# Patient Record
Sex: Male | Born: 1970 | Race: White | Hispanic: No | Marital: Single | Smoking: Never smoker
Health system: Southern US, Community
[De-identification: ages and names within clinical notes are randomized; demographics above are authoritative.]

## PROBLEM LIST (undated history)

## (undated) DIAGNOSIS — E079 Disorder of thyroid, unspecified: Secondary | ICD-10-CM

## (undated) DIAGNOSIS — F329 Major depressive disorder, single episode, unspecified: Secondary | ICD-10-CM

## (undated) DIAGNOSIS — F32A Depression, unspecified: Secondary | ICD-10-CM

## (undated) DIAGNOSIS — T7840XA Allergy, unspecified, initial encounter: Secondary | ICD-10-CM

## (undated) HISTORY — PX: EYE SURGERY: SHX253

## (undated) HISTORY — DX: Depression, unspecified: F32.A

## (undated) HISTORY — DX: Major depressive disorder, single episode, unspecified: F32.9

## (undated) HISTORY — PX: FRACTURE SURGERY: SHX138

## (undated) HISTORY — DX: Disorder of thyroid, unspecified: E07.9

## (undated) HISTORY — DX: Allergy, unspecified, initial encounter: T78.40XA

## (undated) HISTORY — PX: SHOULDER SURGERY: SHX246

---

## 1997-06-04 ENCOUNTER — Emergency Department (HOSPITAL_COMMUNITY): Admission: EM | Admit: 1997-06-04 | Discharge: 1997-06-04 | Payer: Self-pay | Admitting: Emergency Medicine

## 1998-01-15 ENCOUNTER — Ambulatory Visit (HOSPITAL_BASED_OUTPATIENT_CLINIC_OR_DEPARTMENT_OTHER): Admission: RE | Admit: 1998-01-15 | Discharge: 1998-01-15 | Payer: Self-pay | Admitting: Orthopedic Surgery

## 1998-05-19 ENCOUNTER — Emergency Department (HOSPITAL_COMMUNITY): Admission: EM | Admit: 1998-05-19 | Discharge: 1998-05-19 | Payer: Self-pay | Admitting: Emergency Medicine

## 2011-01-27 ENCOUNTER — Ambulatory Visit (INDEPENDENT_AMBULATORY_CARE_PROVIDER_SITE_OTHER): Payer: 59

## 2011-01-27 DIAGNOSIS — J029 Acute pharyngitis, unspecified: Secondary | ICD-10-CM

## 2011-01-27 DIAGNOSIS — J020 Streptococcal pharyngitis: Secondary | ICD-10-CM

## 2011-11-03 ENCOUNTER — Ambulatory Visit (INDEPENDENT_AMBULATORY_CARE_PROVIDER_SITE_OTHER): Payer: 59 | Admitting: Physician Assistant

## 2011-11-03 VITALS — BP 126/74 | HR 78 | Temp 98.0°F | Resp 18 | Ht 70.5 in | Wt 203.0 lb

## 2011-11-03 DIAGNOSIS — Z23 Encounter for immunization: Secondary | ICD-10-CM

## 2011-11-03 DIAGNOSIS — Z202 Contact with and (suspected) exposure to infections with a predominantly sexual mode of transmission: Secondary | ICD-10-CM

## 2011-11-03 LAB — POCT URINALYSIS DIPSTICK
Bilirubin, UA: NEGATIVE
Glucose, UA: NEGATIVE
Nitrite, UA: NEGATIVE
Urobilinogen, UA: 0.2

## 2011-11-03 LAB — POCT CBC
Granulocyte percent: 71.3 %G (ref 37–80)
HCT, POC: 50.4 % (ref 43.5–53.7)
MCH, POC: 30.9 pg (ref 27–31.2)
MCV: 96.7 fL (ref 80–97)
POC LYMPH PERCENT: 24.4 %L (ref 10–50)
RBC: 5.21 M/uL (ref 4.69–6.13)
RDW, POC: 13.4 %
WBC: 8.4 10*3/uL (ref 4.6–10.2)

## 2011-11-03 LAB — POCT UA - MICROSCOPIC ONLY
RBC, urine, microscopic: NEGATIVE
WBC, Ur, HPF, POC: NEGATIVE

## 2011-11-03 NOTE — Progress Notes (Signed)
  Subjective:    Patient ID: Roger Oliver, male    DOB: [birth year], 41 y.o.   MRN: [id number]  HPI40 yr old CM here for STI screening.  He had a partner recently who told him she has HSV.  He has a history of cold sores but he hasnt had one in years.  He denies any lesions.  No dysuria, frequency, or penile discharge.   Review of Systems  All other systems reviewed and are negative.       Objective:   Physical Exam  Nursing note and vitals reviewed. Constitutional: He is oriented to person, place, and time. He appears well-developed and well-nourished.  HENT:  Head: Normocephalic and atraumatic.  Cardiovascular: Normal rate, regular rhythm and normal heart sounds.   Pulmonary/Chest: Effort normal and breath sounds normal.  Neurological: He is alert and oriented to person, place, and time.  Skin: Skin is warm and dry.      Results for orders placed in visit on 11/03/11  POCT CBC      Component Value Range   WBC 8.4  4.6 - 10.2 K/uL   Lymph, poc 2.0  0.6 - 3.4   POC LYMPH PERCENT 24.4  10 - 50 %L   MID (cbc) 0.4  0 - 0.9   POC MID % 4.3  0 - 12 %M   POC Granulocyte 6.0  2 - 6.9   Granulocyte percent 71.3  37 - 80 %G   RBC 5.21  4.69 - 6.13 M/uL   Hemoglobin 16.1  14.1 - 18.1 g/dL   HCT, POC 56.2  13.0 - 53.7 %   MCV 96.7  80 - 97 fL   MCH, POC 30.9  27 - 31.2 pg   MCHC 31.9  31.8 - 35.4 g/dL   RDW, POC 86.5     Platelet Count, POC 332  142 - 424 K/uL   MPV 7.8  0 - 99.8 fL  POCT URINALYSIS DIPSTICK      Component Value Range   Color, UA yellow     Clarity, UA clear     Glucose, UA neg     Bilirubin, UA neg     Ketones, UA neg     Spec Grav, UA <=1.005     Blood, UA neg     pH, UA 6.5     Protein, UA neg     Urobilinogen, UA 0.2     Nitrite, UA neg     Leukocytes, UA Negative         Assessment & Plan:  Possible exposure to herpes STI testing

## 2011-11-04 LAB — HIV ANTIBODY (ROUTINE TESTING W REFLEX): HIV: NONREACTIVE

## 2011-11-04 LAB — HSV(HERPES SIMPLEX VRS) I + II AB-IGG
HSV 1 Glycoprotein G Ab, IgG: 8.09 IV — ABNORMAL HIGH
HSV 2 Glycoprotein G Ab, IgG: 0.73 IV

## 2011-11-04 LAB — RPR

## 2011-11-04 LAB — GC/CHLAMYDIA PROBE AMP, URINE
Chlamydia, Swab/Urine, PCR: NEGATIVE
GC Probe Amp, Urine: NEGATIVE

## 2012-09-13 ENCOUNTER — Ambulatory Visit (INDEPENDENT_AMBULATORY_CARE_PROVIDER_SITE_OTHER): Payer: 59 | Admitting: Emergency Medicine

## 2012-09-13 ENCOUNTER — Ambulatory Visit: Payer: 59

## 2012-09-13 VITALS — BP 124/82 | HR 69 | Temp 97.7°F | Resp 18 | Ht 72.0 in | Wt 206.6 lb

## 2012-09-13 DIAGNOSIS — M25521 Pain in right elbow: Secondary | ICD-10-CM

## 2012-09-13 DIAGNOSIS — M25529 Pain in unspecified elbow: Secondary | ICD-10-CM

## 2012-09-13 MED ORDER — MELOXICAM 15 MG PO TABS: 15.0000 mg | ORAL_TABLET | Freq: Every day | ORAL | Status: DC

## 2012-09-13 NOTE — Progress Notes (Signed)
  Subjective:    Patient ID: Roger Oliver, male    DOB: 06/27/1970, 42 y.o.   MRN: [id number]  HPI  42 YO male patient here with right elbow pain. The pain is more in the antecubital area. No injury recalled. He does lift weights 2-3 times a week. It has been hurting for the last week. Movement makes the pain worse. Pain radiates into his shoulder. He has tried Aleve with no resolution.   Yesterday the pain was so severe that he did not use the arm all day long. Today it feels better than it has in the past week.  He still has pain.   Patient notes that when he is jogging he notices his fingers in the right hand become numb.    Review of Systems     Objective:   Physical Exam there is significant tenderness in the right antecubital fossa to there is no definite tendon rupture identified. He does have some weakness with grip strength  [facility] reading (PRIMARY) by  Dr.Devontae Casasola no fracture seen        Assessment & Plan:  I suspect patient has a partial tear of the right biceps tendon or other structures in the right antecubital fossa. We'll schedule an appointment to see the orthopedist placement of sling he is to be on the use of the right arm

## 2012-09-21 ENCOUNTER — Ambulatory Visit (INDEPENDENT_AMBULATORY_CARE_PROVIDER_SITE_OTHER): Payer: 59 | Admitting: Family Medicine

## 2012-09-21 VITALS — BP 123/74 | HR 77 | Temp 98.8°F | Resp 18 | Wt 204.0 lb

## 2012-09-21 DIAGNOSIS — Z202 Contact with and (suspected) exposure to infections with a predominantly sexual mode of transmission: Secondary | ICD-10-CM

## 2012-09-21 DIAGNOSIS — Z2089 Contact with and (suspected) exposure to other communicable diseases: Secondary | ICD-10-CM

## 2012-09-21 DIAGNOSIS — N5089 Other specified disorders of the male genital organs: Secondary | ICD-10-CM

## 2012-09-21 NOTE — Progress Notes (Signed)
  Subjective:    Patient ID: Roger Oliver, male    DOB: 1971-02-10, 42 y.o.   MRN: [id number]  HPI Patient presents with concern over lesions noted on his penis for last 2 weeks and irritation in his groin for last year when exercising.  He admits to sexual partner one year ago who told him that she had herpes afterwards, unsure if this is true.  He has had one negative test since then.  No new exposures.  No dysuria, urgency, frequency or discharge.  Past Medical History  Diagnosis Date  . Allergy    No past surgical history on file. No Known Allergies    Review of Systems  Constitutional: Negative for fever and chills.  Respiratory: Negative for cough.   Cardiovascular: Negative for chest pain.  Gastrointestinal: Negative for nausea and vomiting.  Genitourinary: Negative for dysuria, urgency, frequency, hematuria, flank pain, discharge, penile swelling, enuresis, penile pain and testicular pain.       Objective:   Physical Exam Blood pressure 123/74, pulse 77, temperature 98.8 F (37.1 C), temperature source Oral, resp. rate 18, weight 204 lb (92.534 kg), SpO2 100.00%. Body mass index is 27.66 kg/(m^2). Well-developed, well nourished male who is awake, alert and oriented, in NAD. HEENT: [place]/AT, EOMI.  Sclera and conjunctiva are clear.  Neck: supple, non-tender, no lymphadenopathy, thyromegaly. Heart: RRR, no murmur Lungs: normal effort, CTA Abdomen: normo-active bowel sounds, supple, non-tender, no mass or organomegaly. Extremities: no cyanosis, clubbing or edema. Skin: warm and dry without rash. Psychologic: good mood and appropriate affect, normal speech and behavior. GU:  #4 small lesions noted on glans, no vesicular component.  Small 1-43mm erythematous lesions.  No drainage.  Evidence of shaving.        Assessment & Plan:  A herpes culture was sent today of the lesions.  Advised patient to stop shaving region until irritation goes away.  Will follow culture  results.

## 2012-09-21 NOTE — Patient Instructions (Addendum)
A culture was sent today to evaluate for possible infection.  You should receive a call with the results in the next week.  If the test is positive we will arrange for treatment.  In the meantime, stop cleaning the area with alcohol and avoid shaving the area.

## 2012-09-28 NOTE — Progress Notes (Signed)
History and physical exam reviewed with Dr. McGrath. Agree with assessment and plan. 

## 2012-10-23 ENCOUNTER — Ambulatory Visit (INDEPENDENT_AMBULATORY_CARE_PROVIDER_SITE_OTHER): Payer: 59 | Admitting: Physician Assistant

## 2012-10-23 VITALS — BP 106/62 | HR 77 | Temp 98.7°F | Resp 18 | Wt 195.0 lb

## 2012-10-23 DIAGNOSIS — J069 Acute upper respiratory infection, unspecified: Secondary | ICD-10-CM

## 2012-10-23 MED ORDER — GUAIFENESIN ER 1200 MG PO TB12: 1.0000 | ORAL_TABLET | Freq: Two times a day (BID) | ORAL | Status: DC | PRN

## 2012-10-23 MED ORDER — IPRATROPIUM BROMIDE 0.03 % NA SOLN: 2.0000 | Freq: Two times a day (BID) | NASAL | Status: DC

## 2012-10-23 MED ORDER — HYDROCODONE-HOMATROPINE 5-1.5 MG/5ML PO SYRP: 5.0000 mL | ORAL_SOLUTION | Freq: Three times a day (TID) | ORAL | Status: DC | PRN

## 2012-10-23 MED ORDER — BENZONATATE 100 MG PO CAPS: 100.0000 mg | ORAL_CAPSULE | Freq: Three times a day (TID) | ORAL | Status: DC | PRN

## 2012-10-23 NOTE — Progress Notes (Signed)
  Subjective:    Patient ID: Roger Oliver, male    DOB: [birth year], 42 y.o.   MRN: [id number]  HPI   Roger Oliver is a 42 yr old male here with concern for illness.  States he has sore throat, nasal congestion, HA, ear pressure and is coughing constantly.  Symptoms have been present for 2 days.  Cough keeps him awake at night.  He uses CPAP nightly for sleep apnea and was unable to sleep due to cough.  Denies fever or chills.  Cough is occasionally productive of green mucus.  He has not used anything for his symptoms but has been increasing his fluid intake.     Review of Systems  Constitutional: Negative for fever and chills.  HENT: Positive for ear pain, congestion and sore throat.   Respiratory: Positive for cough. Negative for shortness of breath and wheezing.   Cardiovascular: Negative.   Gastrointestinal: Negative.   Musculoskeletal: Negative.   Skin: Negative.   Neurological: Positive for headaches.       Objective:   Physical Exam  Vitals reviewed. Constitutional: He is oriented to person, place, and time. He appears well-developed and well-nourished. No distress.  HENT:  Head: Normocephalic and atraumatic.  Right Ear: External ear and ear canal normal. Tympanic membrane is injected.  Left Ear: External ear and ear canal normal. Tympanic membrane is injected.  Nose: Nose normal. Right sinus exhibits no maxillary sinus tenderness and no frontal sinus tenderness. Left sinus exhibits no maxillary sinus tenderness and no frontal sinus tenderness.  Mouth/Throat: Uvula is midline, oropharynx is clear and moist and mucous membranes are normal.  Eyes: Conjunctivae are normal. No scleral icterus.  Neck: Neck supple.  Cardiovascular: Normal rate, regular rhythm and normal heart sounds.   Pulmonary/Chest: Effort normal and breath sounds normal. He has no wheezes. He has no rales.  Lymphadenopathy:    He has no cervical adenopathy.  Neurological: He is alert and oriented to person,  place, and time.  Skin: Skin is warm and dry.  Psychiatric: He has a normal mood and affect. His behavior is normal.        Assessment & Plan:  Viral URI with cough - Plan: HYDROcodone-homatropine (HYCODAN) 5-1.5 MG/5ML syrup, benzonatate (TESSALON) 100 MG capsule, ipratropium (ATROVENT) 0.03 % nasal spray, Guaifenesin (MUCINEX MAXIMUM STRENGTH) 1200 MG TB12   Roger Oliver is a 42 yr old male here with 2 days of URI symptoms.  Likely viral.  Will treat symptoms with Atrovent, Tessalon, Mucinex, and Hycodan.  Push fluids, rest.  RTC if worsening or not improving.

## 2012-10-23 NOTE — Patient Instructions (Addendum)
Atrovent nasal spray 2-3 times per day for relief of nasal congestion and post-nasal drainage.  Tessalon and Hycodan for cough (Hycodan will make you sleepy, so take at bedtime only)  Mucinex twice daily to help break up the congestion  Plenty of fluids (water is best!) and rest.  If worsening (fever >101.61F, worsening cough, shortness of breath, worsening pain) or not improving, please let us know.     Upper Respiratory Infection, Adult An upper respiratory infection (URI) is also sometimes known as the common cold. The upper respiratory tract includes the nose, sinuses, throat, trachea, and bronchi. Bronchi are the airways leading to the lungs. Most people improve within 1 week, but symptoms can last up to 2 weeks. A residual cough may last even longer.  CAUSES Many different viruses can infect the tissues lining the upper respiratory tract. The tissues become irritated and inflamed and often become very moist. Mucus production is also common. A cold is contagious. You can easily spread the virus to others by oral contact. This includes kissing, sharing a glass, coughing, or sneezing. Touching your mouth or nose and then touching a surface, which is then touched by another person, can also spread the virus. SYMPTOMS  Symptoms typically develop 1 to 3 days after you come in contact with a cold virus. Symptoms vary from person to person. They may include:  Runny nose.  Sneezing.  Nasal congestion.  Sinus irritation.  Sore throat.  Loss of voice (laryngitis).  Cough.  Fatigue.  Muscle aches.  Loss of appetite.  Headache.  Low-grade fever. DIAGNOSIS  You might diagnose your own cold based on familiar symptoms, since most people get a cold 2 to 3 times a year. Your caregiver can confirm this based on your exam. Most importantly, your caregiver can check that your symptoms are not due to another disease such as strep throat, sinusitis, pneumonia, asthma, or epiglottitis. Blood  tests, throat tests, and X-rays are not necessary to diagnose a common cold, but they may sometimes be helpful in excluding other more serious diseases. Your caregiver will decide if any further tests are required. RISKS AND COMPLICATIONS  You may be at risk for a more severe case of the common cold if you smoke cigarettes, have chronic heart disease (such as heart failure) or lung disease (such as asthma), or if you have a weakened immune system. The very young and very old are also at risk for more serious infections. Bacterial sinusitis, middle ear infections, and bacterial pneumonia can complicate the common cold. The common cold can worsen asthma and chronic obstructive pulmonary disease (COPD). Sometimes, these complications can require emergency medical care and may be life-threatening. PREVENTION  The best way to protect against getting a cold is to practice good hygiene. Avoid oral or hand contact with people with cold symptoms. Wash your hands often if contact occurs. There is no clear evidence that vitamin C, vitamin E, echinacea, or exercise reduces the chance of developing a cold. However, it is always recommended to get plenty of rest and practice good nutrition. TREATMENT  Treatment is directed at relieving symptoms. There is no cure. Antibiotics are not effective, because the infection is caused by a virus, not by bacteria. Treatment may include:  Increased fluid intake. Sports drinks offer valuable electrolytes, sugars, and fluids.  Breathing heated mist or steam (vaporizer or shower).  Eating chicken soup or other clear broths, and maintaining good nutrition.  Getting plenty of rest.  Using gargles or lozenges for comfort.  Controlling fevers with ibuprofen or acetaminophen as directed by your caregiver.  Increasing usage of your inhaler if you have asthma. Zinc gel and zinc lozenges, taken in the first 24 hours of the common cold, can shorten the duration and lessen the  severity of symptoms. Pain medicines may help with fever, muscle aches, and throat pain. A variety of non-prescription medicines are available to treat congestion and runny nose. Your caregiver can make recommendations and may suggest nasal or lung inhalers for other symptoms.  HOME CARE INSTRUCTIONS   Only take over-the-counter or prescription medicines for pain, discomfort, or fever as directed by your caregiver.  Use a warm mist humidifier or inhale steam from a shower to increase air moisture. This may keep secretions moist and make it easier to breathe.  Drink enough water and fluids to keep your urine clear or pale yellow.  Rest as needed.  Return to work when your temperature has returned to normal or as your caregiver advises. You may need to stay home longer to avoid infecting others. You can also use a face mask and careful hand washing to prevent spread of the virus. SEEK MEDICAL CARE IF:   After the first few days, you feel you are getting worse rather than better.  You need your caregiver's advice about medicines to control symptoms.  You develop chills, worsening shortness of breath, or brown or red sputum. These may be signs of pneumonia.  You develop yellow or brown nasal discharge or pain in the face, especially when you bend forward. These may be signs of sinusitis.  You develop a fever, swollen neck glands, pain with swallowing, or white areas in the back of your throat. These may be signs of strep throat. SEEK IMMEDIATE MEDICAL CARE IF:   You have a fever.  You develop severe or persistent headache, ear pain, sinus pain, or chest pain.  You develop wheezing, a prolonged cough, cough up blood, or have a change in your usual mucus (if you have chronic lung disease).  You develop sore muscles or a stiff neck. Document Released: 07/23/2000 Document Revised: 04/21/2011 Document Reviewed: 05/31/2010 [facility] Patient Information 2014 [place], [place].

## 2013-01-10 ENCOUNTER — Ambulatory Visit: Payer: 59

## 2013-01-10 ENCOUNTER — Ambulatory Visit (INDEPENDENT_AMBULATORY_CARE_PROVIDER_SITE_OTHER): Payer: 59 | Admitting: Family Medicine

## 2013-01-10 VITALS — BP 140/58 | HR 86 | Temp 98.0°F | Resp 18 | Ht 72.0 in | Wt 204.0 lb

## 2013-01-10 DIAGNOSIS — R059 Cough, unspecified: Secondary | ICD-10-CM

## 2013-01-10 DIAGNOSIS — R51 Headache: Secondary | ICD-10-CM

## 2013-01-10 DIAGNOSIS — R05 Cough: Secondary | ICD-10-CM

## 2013-01-10 DIAGNOSIS — J019 Acute sinusitis, unspecified: Secondary | ICD-10-CM

## 2013-01-10 DIAGNOSIS — J069 Acute upper respiratory infection, unspecified: Secondary | ICD-10-CM

## 2013-01-10 LAB — POCT CBC
Granulocyte percent: 85.9 %G — AB (ref 37–80)
MCV: 96.8 fL (ref 80–97)
MID (cbc): 0.9 (ref 0–0.9)
RBC: 4.9 M/uL (ref 4.69–6.13)

## 2013-01-10 LAB — POCT INFLUENZA A/B: Influenza B, POC: NEGATIVE

## 2013-01-10 MED ORDER — HYDROCODONE-HOMATROPINE 5-1.5 MG/5ML PO SYRP: 5.0000 mL | ORAL_SOLUTION | Freq: Three times a day (TID) | ORAL | Status: DC | PRN

## 2013-01-10 MED ORDER — PSEUDOEPHEDRINE HCL ER 120 MG PO TB12: 120.0000 mg | ORAL_TABLET | Freq: Two times a day (BID) | ORAL | Status: DC

## 2013-01-10 MED ORDER — AMOXICILLIN-POT CLAVULANATE 875-125 MG PO TABS: 1.0000 | ORAL_TABLET | Freq: Two times a day (BID) | ORAL | Status: DC

## 2013-01-10 MED ORDER — GUAIFENESIN ER 1200 MG PO TB12: 1.0000 | ORAL_TABLET | Freq: Two times a day (BID) | ORAL | Status: DC | PRN

## 2013-01-10 MED ORDER — IPRATROPIUM BROMIDE 0.03 % NA SOLN: 2.0000 | Freq: Two times a day (BID) | NASAL | Status: DC

## 2013-01-10 NOTE — Progress Notes (Addendum)
Subjective:    Patient ID: Roger Oliver, male    DOB: 04-Jul-1970, 42 y.o.   MRN: [id number] This chart was scribed for Roger Sorenson, MD by Valera Castle, ED Scribe. This patient was seen in room 11 and the patient's care was started at 8:38 AM.  Chief Complaint  Patient presents with  . Cough    X yesterday  . Headache    X yesterday  . Sore Throat    X yesterday   HPI [name] is a 42 y.o. male who presents to the [facility] complaining of sudden, moderate, waxing and waning, worsening, bilateral headache, onset yesterday morning. He reports diaphoresis last night, and associated sore throat after he woke up this morning. He states he had a hot drink that helped relieve the sore throat some. He also reports associated sinus pressure, postnasal drip, congestion, cough, productive of brown sputum, chills, and intermittent SOB due to the congestion.  He reports a h/o sleep apnea, stating he uses a sleep machine with a humidifier which seems to make his sxs much worse. He was unable to use his CPAP last night due to sxs so not sleeping as well.  He reports taking some Ibuprofen, 2 tablets, with little relief. He reports taking Zinc this morning. He denies being around anyone who has been sick. He denies chest pain, decreased appetite, bladder or bowel incontinence, fever, and any other associated symptoms.    He denies h/o smoking.  There are no active problems to display for this patient.  Past Medical History  Diagnosis Date  . Allergy   . Thyroid disease     Hypothyroidism   History reviewed. No pertinent past surgical history. Allergies  Allergen Reactions  . Shellfish Allergy    Prior to Admission medications   Medication Sig Start Date End Date Taking? Authorizing Provider  levothyroxine (SYNTHROID, LEVOTHROID) 50 MCG tablet Take 50 mcg by mouth daily.   Yes Historical Provider, MD  traZODone (DESYREL) 50 MG tablet Take 50 mg by mouth at bedtime.   Yes Historical Provider,  MD  benzonatate (TESSALON) 100 MG capsule Take 1-2 capsules (100-200 mg total) by mouth 3 (three) times daily as needed for cough. 10/23/12   Eleanore Delia Chimes, PA-C  Guaifenesin (MUCINEX MAXIMUM STRENGTH) 1200 MG TB12 Take 1 tablet (1,200 mg total) by mouth every 12 (twelve) hours as needed. 10/23/12   Eleanore Delia Chimes, PA-C  HYDROcodone-homatropine (HYCODAN) 5-1.5 MG/5ML syrup Take 5 mLs by mouth every 8 (eight) hours as needed for cough. 10/23/12   Eleanore Delia Chimes, PA-C  ipratropium (ATROVENT) 0.03 % nasal spray Place 2 sprays into the nose 2 (two) times daily. 10/23/12   Godfrey Pick, PA-C   Review of Systems  Constitutional: Positive for chills, diaphoresis, activity change and fatigue. Negative for fever, appetite change and unexpected weight change.  HENT: Positive for congestion, postnasal drip, sinus pressure and sore throat.   Respiratory: Positive for apnea, cough (productive of brown sputum) and shortness of breath. Negative for chest tightness and wheezing.   Cardiovascular: Negative for chest pain.  Genitourinary:       Negative for bladder and bowel incontinence.  Skin: Negative for rash.  Neurological: Positive for headaches (bilateral).  Hematological: Negative for adenopathy.  Psychiatric/Behavioral: Positive for sleep disturbance.    Triage Vitals: BP 140/58  Pulse 86  Temp(Src) 98 F (36.7 C) (Oral)  Resp 18  Ht 6' (1.829 m)  Wt 204 lb (92.534 kg)  BMI 27.66  kg/m2  SpO2 98%    Objective:   Physical Exam  Nursing note and vitals reviewed. Constitutional: He is oriented to person, place, and time. He appears well-developed and well-nourished. No distress.  HENT:  Head: Normocephalic and atraumatic.  Right Ear: External ear and ear canal normal. Tympanic membrane is injected and retracted.  Left Ear: External ear and ear canal normal. Tympanic membrane is injected. A middle ear effusion is present.  Nose: Mucosal edema (bilateral) present.  Mouth/Throat: Uvula is  midline and mucous membranes are normal. Posterior oropharyngeal edema (3 + over tonsils) and posterior oropharyngeal erythema present. No oropharyngeal exudate or tonsillar abscesses.  Eyes: Conjunctivae and EOM are normal.  Neck: Trachea normal. Neck supple. No JVD present. No tracheal deviation present. No mass and no thyromegaly present.  Cardiovascular: Normal rate, regular rhythm and normal heart sounds.  Exam reveals no gallop and no friction rub.   No murmur heard. Pulmonary/Chest: Effort normal. No stridor. No respiratory distress. He has decreased breath sounds. He has no wheezes. He has no rales.  Musculoskeletal: Normal range of motion.  Lymphadenopathy:    He has no cervical adenopathy.  Neurological: He is alert and oriented to person, place, and time.  Skin: Skin is warm and dry.  Diaphoretic skin noted on exam.  Psychiatric: He has a normal mood and affect. His behavior is normal.      Results for orders placed in visit on 01/10/13  POCT INFLUENZA A/B      Result Value Range   Influenza A, POC Negative     Influenza B, POC Negative    POCT CBC      Result Value Range   WBC 18.2 (*) 4.6 - 10.2 K/uL   Lymph, poc 1.7  0.6 - 3.4   POC LYMPH PERCENT 9.3 (*) 10 - 50 %L   MID (cbc) 0.9  0 - 0.9   POC MID % 4.8  0 - 12 %M   POC Granulocyte 15.6 (*) 2 - 6.9   Granulocyte percent 85.9 (*) 37 - 80 %G   RBC 4.90  4.69 - 6.13 M/uL   Hemoglobin 15.2  14.1 - 18.1 g/dL   HCT, POC 10.2  72.5 - 53.7 %   MCV 96.8  80 - 97 fL   MCH, POC 31.0  27 - 31.2 pg   MCHC 32.1  31.8 - 35.4 g/dL   RDW, POC 36.6     Platelet Count, POC 198  142 - 424 K/uL   MPV 7.3  0 - 99.8 fL   [facility] reading (PRIMARY) by  Dr. Clelia Croft. CXR: No acute abnormality. Assessment & Plan:  Headache(784.0)  Acute URI - Plan: POCT Influenza A/B, POCT CBC  Cough - Plan: DG Chest 2 View  Sinusitis, acute - Plan: Augmentin, sudafed, HYDROcodone-homatropine (HYCODAN) 5-1.5 MG/5ML syrup, Guaifenesin (MUCINEX MAXIMUM  STRENGTH) 1200 MG TB12, ipratropium (ATROVENT) 0.03 % nasal spray.  See pt instructions  Meds ordered this encounter  Medications  . HYDROcodone-homatropine (HYCODAN) 5-1.5 MG/5ML syrup    Sig: Take 5 mLs by mouth every 8 (eight) hours as needed for cough.    Dispense:  120 mL    Refill:  0    Order Specific Question:  Supervising Provider    Answer:  Ethelda Chick [[id number]]  . Guaifenesin (MUCINEX MAXIMUM STRENGTH) 1200 MG TB12    Sig: Take 1 tablet (1,200 mg total) by mouth every 12 (twelve) hours as needed.    Dispense:  14 tablet  Refill:  1    Order Specific Question:  Supervising Provider    Answer:  Ethelda Chick [[id number]]  . ipratropium (ATROVENT) 0.03 % nasal spray    Sig: Place 2 sprays into the nose 2 (two) times daily.    Dispense:  30 mL    Refill:  1    Order Specific Question:  Supervising Provider    Answer:  Ethelda Chick [[id number]]  . pseudoephedrine (SUDAFED 12 HOUR) 120 MG 12 hr tablet    Sig: Take 1 tablet (120 mg total) by mouth 2 (two) times daily.    Dispense:  30 tablet    Refill:  0  . amoxicillin-clavulanate (AUGMENTIN) 875-125 MG per tablet    Sig: Take 1 tablet by mouth 2 (two) times daily.    Dispense:  28 tablet    Refill:  0    I personally performed the services described in this documentation, which was scribed in my presence. The recorded information has been reviewed and considered, and addended by me as needed.  Roger Sorenson, MD MPH

## 2013-01-10 NOTE — Patient Instructions (Signed)
Hot showers or breathing in steam may help loosen the congestion.  Using a netti pot or sinus rinse is also likely to help you feel better and keep this from progressing.  Use the atrovent nasal spray as needed throughout the day.  I recommend augmenting with 12 hr sudafed (behind the counter) and generic mucinex to help you move out the congestion.  If no improvement or you are getting worse, come back as you might need a course of steroids but hopefully with all of the above, you can avoid it. If you are not feeling well enough to return to work on Thursday or you are feeling worse at all, please return to clinic.  Sinusitis Sinusitis is redness, soreness, and swelling (inflammation) of the paranasal sinuses. Paranasal sinuses are air pockets within the bones of your face (beneath the eyes, the middle of the forehead, or above the eyes). In healthy paranasal sinuses, mucus is able to drain out, and air is able to circulate through them by way of your nose. However, when your paranasal sinuses are inflamed, mucus and air can become trapped. This can allow bacteria and other germs to grow and cause infection. Sinusitis can develop quickly and last only a short time (acute) or continue over a long period (chronic). Sinusitis that lasts for more than 12 weeks is considered chronic.  CAUSES  Causes of sinusitis include:  Allergies.  Structural abnormalities, such as displacement of the cartilage that separates your nostrils (deviated septum), which can decrease the air flow through your nose and sinuses and affect sinus drainage.  Functional abnormalities, such as when the small hairs (cilia) that line your sinuses and help remove mucus do not work properly or are not present. SYMPTOMS  Symptoms of acute and chronic sinusitis are the same. The primary symptoms are pain and pressure around the affected sinuses. Other symptoms include:  Upper toothache.  Earache.  Headache.  Bad  breath.  Decreased sense of smell and taste.  A cough, which worsens when you are lying flat.  Fatigue.  Fever.  Thick drainage from your nose, which often is green and may contain pus (purulent).  Swelling and warmth over the affected sinuses. DIAGNOSIS  Your caregiver will perform a physical exam. During the exam, your caregiver may:  Look in your nose for signs of abnormal growths in your nostrils (nasal polyps).  Tap over the affected sinus to check for signs of infection.  View the inside of your sinuses (endoscopy) with a special imaging device with a light attached (endoscope), which is inserted into your sinuses. If your caregiver suspects that you have chronic sinusitis, one or more of the following tests may be recommended:  Allergy tests.  Nasal culture A sample of mucus is taken from your nose and sent to a lab and screened for bacteria.  Nasal cytology A sample of mucus is taken from your nose and examined by your caregiver to determine if your sinusitis is related to an allergy. TREATMENT  Most cases of acute sinusitis are related to a viral infection and will resolve on their own within 10 days. Sometimes medicines are prescribed to help relieve symptoms (pain medicine, decongestants, nasal steroid sprays, or saline sprays).  However, for sinusitis related to a bacterial infection, your caregiver will prescribe antibiotic medicines. These are medicines that will help kill the bacteria causing the infection.  Rarely, sinusitis is caused by a fungal infection. In theses cases, your caregiver will prescribe antifungal medicine. For some cases of  chronic sinusitis, surgery is needed. Generally, these are cases in which sinusitis recurs more than 3 times per year, despite other treatments. HOME CARE INSTRUCTIONS   Drink plenty of water. Water helps thin the mucus so your sinuses can drain more easily.  Use a humidifier.  Inhale steam 3 to 4 times a day (for example,  sit in the bathroom with the shower running).  Apply a warm, moist washcloth to your face 3 to 4 times a day, or as directed by your caregiver.  Use saline nasal sprays to help moisten and clean your sinuses.  Take over-the-counter or prescription medicines for pain, discomfort, or fever only as directed by your caregiver. SEEK IMMEDIATE MEDICAL CARE IF:  You have increasing pain or severe headaches.  You have nausea, vomiting, or drowsiness.  You have swelling around your face.  You have vision problems.  You have a stiff neck.  You have difficulty breathing. MAKE SURE YOU:   Understand these instructions.  Will watch your condition.  Will get help right away if you are not doing well or get worse. Document Released: 01/27/2005 Document Revised: 04/21/2011 Document Reviewed: 02/11/2011 [facility] Patient Information 2014 [place], [place].

## 2013-08-26 ENCOUNTER — Ambulatory Visit (INDEPENDENT_AMBULATORY_CARE_PROVIDER_SITE_OTHER): Payer: 59 | Admitting: Family Medicine

## 2013-08-26 ENCOUNTER — Ambulatory Visit (INDEPENDENT_AMBULATORY_CARE_PROVIDER_SITE_OTHER): Payer: 59

## 2013-08-26 VITALS — BP 120/52 | HR 55 | Temp 97.8°F | Resp 16 | Ht 71.75 in | Wt 192.4 lb

## 2013-08-26 DIAGNOSIS — IMO0002 Reserved for concepts with insufficient information to code with codable children: Secondary | ICD-10-CM

## 2013-08-26 DIAGNOSIS — R202 Paresthesia of skin: Secondary | ICD-10-CM

## 2013-08-26 DIAGNOSIS — S46911A Strain of unspecified muscle, fascia and tendon at shoulder and upper arm level, right arm, initial encounter: Secondary | ICD-10-CM

## 2013-08-26 DIAGNOSIS — M25519 Pain in unspecified shoulder: Secondary | ICD-10-CM

## 2013-08-26 DIAGNOSIS — M25511 Pain in right shoulder: Secondary | ICD-10-CM

## 2013-08-26 DIAGNOSIS — R209 Unspecified disturbances of skin sensation: Secondary | ICD-10-CM

## 2013-08-26 MED ORDER — PREDNISONE 20 MG PO TABS: ORAL_TABLET | ORAL | Status: DC

## 2013-08-26 MED ORDER — DICLOFENAC SODIUM 75 MG PO TBEC: 75.0000 mg | DELAYED_RELEASE_TABLET | Freq: Two times a day (BID) | ORAL | Status: DC

## 2013-08-26 MED ORDER — CYCLOBENZAPRINE HCL 5 MG PO TABS: ORAL_TABLET | ORAL | Status: DC

## 2013-08-26 NOTE — Progress Notes (Signed)
Subjective: 43 year old man who is here complaining of pain in his right shoulder. He had about 5 days when he was pulling on a bar to release a valve. There was tightening up in his right shoulder in area, an area that he had injured some time ago in the past. When he had problems in the past, he also had all kinds of problems with workers compensation. He is not at all interested in using workers comp. He prefers to use his own doctors. The hands a abnormal sensation in the right deltoid region. He moves certain ways and get a which shoots up toward his neck. He feels like he strained something in the shoulder itself. The intense burning pain woke him up this morning.  Objective: Neck has good range of motion and is nontender. Trapezius area is nontender. Shoulder has fairly good range of motion but gets occasional twinge of pain when he moves it around such as reaching across to his other shoulder. He is only minimally tender in the shoulder girdle and in the deltoid region. Biceps seems nontender. Grip is good. Pulses good. Sensory grossly normal in his hand but he feels like he has a abnormal sensation in the deltoid region.  Assessment: Right shoulder pain/strain Paresthesia  Plan: X-ray  [facility] reading (PRIMARY) by  Dr. Alwyn RenHopper Normal shoulder   Assessment: Shoulder pain and strain and paresthesia  Plan: With the burning pain shooting through there it sounds like a nerve may be irritated. I will go ahead treated with a round of prednisone, also in savings and when necessary pain medication and at bedtime muscle relaxant. If he does not rapidly improve we will refer him to PT and possibly to his shoulder orthopedist.

## 2013-08-26 NOTE — Patient Instructions (Signed)
Take prednisone 3 pills daily for 2 days, then daily for 2 days, then one daily for 2 days with breakfast  Take diclofenac one twice daily with breakfast and supper  Take muscle relaxants 2 at bedtime. On days that you are not working you can take one in the morning and one in the afternoon. May cause drowsiness.  If not doing better in 5 or 6 days we should recheck

## 2013-11-08 ENCOUNTER — Encounter: Payer: Self-pay | Admitting: Family Medicine

## 2013-11-08 ENCOUNTER — Ambulatory Visit (INDEPENDENT_AMBULATORY_CARE_PROVIDER_SITE_OTHER): Payer: 59 | Admitting: Family Medicine

## 2013-11-08 VITALS — BP 146/75 | HR 58 | Temp 97.3°F | Resp 18 | Ht 70.5 in | Wt 199.4 lb

## 2013-11-08 DIAGNOSIS — S139XXA Sprain of joints and ligaments of unspecified parts of neck, initial encounter: Secondary | ICD-10-CM

## 2013-11-08 DIAGNOSIS — M545 Low back pain, unspecified: Secondary | ICD-10-CM

## 2013-11-08 DIAGNOSIS — S161XXA Strain of muscle, fascia and tendon at neck level, initial encounter: Secondary | ICD-10-CM

## 2013-11-10 MED ORDER — CYCLOBENZAPRINE HCL 10 MG PO TABS: ORAL_TABLET | ORAL | Status: DC
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
## 2014-11-20 ENCOUNTER — Ambulatory Visit
Admission: RE | Admit: 2014-11-20 | Discharge: 2014-11-20 | Disposition: A | Discharge status: Home / Self Care | Payer: Worker's Compensation | Source: Ambulatory Visit | Attending: Nurse Practitioner | Admitting: Nurse Practitioner

## 2014-11-20 ENCOUNTER — Other Ambulatory Visit: Payer: Self-pay | Admitting: Nurse Practitioner

## 2014-11-20 DIAGNOSIS — M25511 Pain in right shoulder: Secondary | ICD-10-CM

## 2015-04-23 ENCOUNTER — Ambulatory Visit (INDEPENDENT_AMBULATORY_CARE_PROVIDER_SITE_OTHER): Payer: Commercial Managed Care - HMO | Admitting: Emergency Medicine

## 2015-04-23 ENCOUNTER — Ambulatory Visit (INDEPENDENT_AMBULATORY_CARE_PROVIDER_SITE_OTHER): Payer: Commercial Managed Care - HMO

## 2015-04-23 VITALS — BP 120/70 | HR 114 | Temp 98.8°F | Resp 20 | Ht 70.5 in | Wt 217.6 lb

## 2015-04-23 DIAGNOSIS — R509 Fever, unspecified: Secondary | ICD-10-CM

## 2015-04-23 DIAGNOSIS — R059 Cough, unspecified: Secondary | ICD-10-CM

## 2015-04-23 DIAGNOSIS — M791 Myalgia: Secondary | ICD-10-CM

## 2015-04-23 DIAGNOSIS — IMO0001 Reserved for inherently not codable concepts without codable children: Secondary | ICD-10-CM

## 2015-04-23 DIAGNOSIS — M609 Myositis, unspecified: Secondary | ICD-10-CM

## 2015-04-23 DIAGNOSIS — R05 Cough: Secondary | ICD-10-CM

## 2015-04-23 DIAGNOSIS — J029 Acute pharyngitis, unspecified: Secondary | ICD-10-CM | POA: Diagnosis not present

## 2015-04-23 LAB — POCT CBC
Granulocyte percent: 91.2 %G — AB (ref 37–80)
HCT, POC: 40 % — AB (ref 43.5–53.7)
Hemoglobin: 15.5 g/dL (ref 14.1–18.1)
LYMPH, POC: 0.4 — AB (ref 0.6–3.4)
MCH, POC: 34.2 pg — AB (ref 27–31.2)
MCHC: 38.7 g/dL — AB (ref 31.8–35.4)
MCV: 88.4 fL (ref 80–97)
MID (CBC): 0.2 (ref 0–0.9)
MPV: 6.3 fL (ref 0–99.8)
PLATELET COUNT, POC: 192 10*3/uL (ref 142–424)
POC Granulocyte: 6.1 (ref 2–6.9)
POC LYMPH %: 5.4 % — AB (ref 10–50)
POC MID %: 3.4 %M (ref 0–12)
RBC: 4.52 M/uL — AB (ref 4.69–6.13)
RDW, POC: 12.7 %
WBC: 6.7 10*3/uL (ref 4.6–10.2)

## 2015-04-23 LAB — POCT INFLUENZA A/B
INFLUENZA B, POC: NEGATIVE
Influenza A, POC: NEGATIVE

## 2015-04-23 LAB — POCT RAPID STREP A (OFFICE): RAPID STREP A SCREEN: NEGATIVE

## 2015-04-23 MED ORDER — OSELTAMIVIR PHOSPHATE 75 MG PO CAPS: 75.0000 mg | ORAL_CAPSULE | Freq: Two times a day (BID) | ORAL | Status: AC

## 2015-04-23 NOTE — Progress Notes (Addendum)
Subjective:  This chart was scribed for Roger ChrisSteven Lavell Supple MD, by [name],scribe, at [facility].  This patient was seen in room 13 and the patient's care was started at 1:29 PM.    Patient ID: Roger Oliver, male    DOB: 1971-01-23, 45 y.o.   MRN: [id number] Chief Complaint  Patient presents with  . Influenza    x 1 day    HPI HPI Comments: Roger Oliver is a 45 y.o. male who presents to the [facility] complaining of waking up this morning with the chills.  He went back to sleep and had a very intense headache when he woke up again.  He has associated symptoms of a sore throat,slight loss of appetite/nausea and body aches. Patient states that he had the heater on "full blast" while coming [facility] today and still felt cold. Patient had pneumonia 4-5 years ago.   Patient had shoulder surgery recently and states that he took pain medication this morning as it was also giving him pain.    There are no active problems to display for this patient.  Past Medical History  Diagnosis Date  . Allergy   . Thyroid disease     Hypothyroidism  . Depression    Past Surgical History  Procedure Laterality Date  . Eye surgery    . Fracture surgery    . Shoulder surgery     Allergies  Allergen Reactions  . Shellfish Allergy    Prior to Admission medications   Medication Sig Start Date End Date Taking? Authorizing Provider  levothyroxine (SYNTHROID, LEVOTHROID) 50 MCG tablet Take 50 mcg by mouth daily.   Yes Historical Provider, MD  traZODone (DESYREL) 50 MG tablet Take 50 mg by mouth at bedtime.   Yes Historical Provider, MD   Social History   Social History  . Marital Status: Single    Spouse Name: N/A  . Number of Children: N/A  . Years of Education: N/A   Occupational History  . Not on file.   Social History Main Topics  . Smoking status: Never Smoker   . Smokeless tobacco: Current User    Types: Snuff  . Alcohol Use: 3.0 oz/week     5 Cans of beer per week  . Drug Use: No  . Sexual Activity: Not on file   Other Topics Concern  . Not on file   Social History Narrative    Review of Systems  Constitutional: Positive for fever, chills and appetite change.  HENT: Positive for sore throat.   Eyes: Negative for pain, redness and itching.  Respiratory: Negative for choking and shortness of breath.   Cardiovascular: Negative for chest pain.  Gastrointestinal: Positive for nausea. Negative for vomiting.  Musculoskeletal: Positive for myalgias. Negative for neck pain and neck stiffness.  Neurological: Positive for headaches. Negative for syncope and speech difficulty.       Objective:   Physical Exam  Filed Vitals:   04/23/15 1324  BP: 120/70  Pulse: 114  Temp: 98.8 F (37.1 C)  TempSrc: Oral  Resp: 20  Height: 5' 10.5" (1.791 m)  Weight: 217 lb 9.6 oz (98.703 kg)  SpO2: 97%    CONSTITUTIONAL: Appears ill but not toxic HEAD: Normocephalic/atraumatic.  EYES: EOMI/PERRL ENMT: Mucous membranes moist NECK: Oliver no meningeal signs SPINE/BACK:entire spine nontender CV: S1/S2 noted, no murmurs/rubs/gallops noted LUNGS: Chest had a few rhonchi but no rales.  SKIN: warm, color normal PSYCH: no abnormalities  of mood noted, alert and oriented to situation Results for orders placed or performed in visit on 04/23/15  POCT Influenza A/B  Result Value Ref Range   Influenza A, POC Negative Negative   Influenza B, POC Negative Negative  POCT rapid strep A  Result Value Ref Range   Rapid Strep A Screen Negative Negative  Dg Chest 2 View  04/23/2015  CLINICAL DATA:  Fever and chills.  Body aches. EXAM: CHEST  2 VIEW COMPARISON:  01/10/2013 FINDINGS: Midline trachea.  Normal heart size and mediastinal contours. Sharp costophrenic angles.  No pneumothorax.  Clear lungs. IMPRESSION: No active cardiopulmonary disease. Electronically Signed   By: Jeronimo Greaves M.D.   On: 04/23/2015 14:23      Assessment & Plan:    Symptoms most consistent with influenza. Even though test is negative we'll treat with Tamiflu twice a day. He was given a note to be out of work until Thursday.I personally performed the services described in this documentation, which was scribed in my presence. The recorded information has been reviewed and is accurate.  Collene Gobble, MD

## 2015-04-23 NOTE — Patient Instructions (Addendum)
   IF you received an x-ray today, you will receive an invoice from [facility]. Please contact [facility] at [phone] with questions or concerns regarding your invoice.   IF you received labwork today, you will receive an invoice from [facility]. Please contact [facility] at [phone] with questions or concerns regarding your invoice.   Our billing staff will not be able to assist you with questions regarding bills from these companies.  You will be contacted with the lab results as soon as they are available. The fastest way to get your results is to activate your My Chart account. Instructions are located on the last page of this paperwork. If you have not heard from us regarding the results in 2 weeks, please contact this office.     Influenza, Adult Influenza ("the flu") is a viral infection of the respiratory tract. It occurs more often in winter months because people spend more time in close contact with one another. Influenza can make you feel very sick. Influenza easily spreads from person to person (contagious). CAUSES  Influenza is caused by a virus that infects the respiratory tract. You can catch the virus by breathing in droplets from an infected person's cough or sneeze. You can also catch the virus by touching something that was recently contaminated with the virus and then touching your mouth, nose, or eyes. RISKS AND COMPLICATIONS You may be at risk for a more severe case of influenza if you smoke cigarettes, have diabetes, have chronic heart disease (such as heart failure) or lung disease (such as asthma), or if you have a weakened immune system. Elderly people and pregnant women are also at risk for more serious infections. The most common problem of influenza is a lung infection (pneumonia). Sometimes, this problem can require emergency medical care and may be life threatening. SIGNS AND SYMPTOMS  Symptoms typically last 4  to 10 days and may include:  Fever.  Chills.  Headache, body aches, and muscle aches.  Sore throat.  Chest discomfort and cough.  Poor appetite.  Weakness or feeling tired.  Dizziness.  Nausea or vomiting. DIAGNOSIS  Diagnosis of influenza is often made based on your history and a physical exam. A nose or throat swab test can be done to confirm the diagnosis. TREATMENT  In mild cases, influenza goes away on its own. Treatment is directed at relieving symptoms. For more severe cases, your health care provider may prescribe antiviral medicines to shorten the sickness. Antibiotic medicines are not effective because the infection is caused by a virus, not by bacteria. HOME CARE INSTRUCTIONS  Take medicines only as directed by your health care provider.  Use a cool mist humidifier to make breathing easier.  Get plenty of rest until your temperature returns to normal. This usually takes 3 to 4 days.  Drink enough fluid to keep your urine clear or pale yellow.  Cover yourmouth and nosewhen coughing or sneezing,and wash your handswellto prevent thevirusfrom spreading.  Stay homefromwork orschool untilthe fever is gonefor at least 1full day. PREVENTION  An annual influenza vaccination (flu shot) is the best way to avoid getting influenza. An annual flu shot is now routinely recommended for all adults in the [place] SEEK MEDICAL CARE IF:  You experiencechest pain, yourcough worsens,or you producemore mucus.  Youhave nausea,vomiting, ordiarrhea.  Your fever returns or gets worse. SEEK IMMEDIATE MEDICAL CARE IF:  You havetrouble breathing, you become short of breath,or your skin ornails becomebluish.  You have severe painor   stiffnessin the neck.  You develop a sudden headache, or pain in the face or ear.  You have nausea or vomiting that you cannot control. MAKE SURE YOU:   Understand these instructions.  Will watch your condition.  Will get help  right away if you are not doing well or get worse.   This information is not intended to replace advice given to you by your health care provider. Make sure you discuss any questions you have with your health care provider.   Document Released: 01/25/2000 Document Revised: 02/17/2014 Document Reviewed: 04/28/2011 Elsevier Interactive Patient Education 2016 [employer]  

## 2016-02-03 DIAGNOSIS — Z79899 Other long term (current) drug therapy: Secondary | ICD-10-CM | POA: Insufficient documentation

## 2016-02-03 DIAGNOSIS — R040 Epistaxis: Secondary | ICD-10-CM | POA: Insufficient documentation

## 2016-02-03 DIAGNOSIS — E039 Hypothyroidism, unspecified: Secondary | ICD-10-CM | POA: Insufficient documentation

## 2016-02-03 DIAGNOSIS — F17290 Nicotine dependence, other tobacco product, uncomplicated: Secondary | ICD-10-CM | POA: Insufficient documentation

## 2016-02-03 NOTE — ED Triage Notes (Signed)
Patient reports nose bleed (right nare) for approximately 3 hours.  Patient denies history of same.

## 2016-02-04 ENCOUNTER — Emergency Department
Admission: EM | Admit: 2016-02-04 | Discharge: 2016-02-04 | Disposition: A | Discharge status: Home / Self Care | Payer: Self-pay | Attending: Emergency Medicine | Admitting: Emergency Medicine

## 2016-02-04 DIAGNOSIS — R040 Epistaxis: Secondary | ICD-10-CM

## 2016-02-04 LAB — COMPREHENSIVE METABOLIC PANEL
ALBUMIN: 4.6 g/dL (ref 3.5–5.0)
ALT: 44 U/L (ref 17–63)
ANION GAP: 7 (ref 5–15)
AST: 42 U/L — AB (ref 15–41)
Alkaline Phosphatase: 67 U/L (ref 38–126)
BILIRUBIN TOTAL: 0.7 mg/dL (ref 0.3–1.2)
BUN: 16 mg/dL (ref 6–20)
CHLORIDE: 104 mmol/L (ref 101–111)
CO2: 27 mmol/L (ref 22–32)
Calcium: 9.4 mg/dL (ref 8.9–10.3)
Creatinine, Ser: 0.93 mg/dL (ref 0.61–1.24)
GFR calc Af Amer: >60 mL/min (ref >60)
GFR calc non Af Amer: >60 mL/min (ref >60)
GLUCOSE: 100 mg/dL — AB (ref 65–99)
POTASSIUM: 3.7 mmol/L (ref 3.5–5.1)
Sodium: 138 mmol/L (ref 135–145)
TOTAL PROTEIN: 8 g/dL (ref 6.5–8.1)

## 2016-02-04 LAB — PROTIME-INR
INR: 0.97
PROTHROMBIN TIME: 12.9 s (ref 11.4–15.2)

## 2016-02-04 LAB — CBC
HEMATOCRIT: 42.5 % (ref 40.0–52.0)
Hemoglobin: 15.1 g/dL (ref 13.0–18.0)
MCH: 31.3 pg (ref 26.0–34.0)
MCHC: 35.6 g/dL (ref 32.0–36.0)
MCV: 87.8 fL (ref 80.0–100.0)
PLATELETS: 260 10*3/uL (ref 150–440)
RBC: 4.84 MIL/uL (ref 4.40–5.90)
RDW: 13 % (ref 11.5–14.5)
WBC: 9.8 10*3/uL (ref 3.8–10.6)

## 2016-02-04 LAB — APTT: APTT: 28 s (ref 24–36)

## 2016-02-04 MED ORDER — HYDROCOD POLST-CPM POLST ER 10-8 MG/5ML PO SUER
5.0000 mL | Freq: Once | ORAL | Status: AC
  Administered 2016-02-04: 5 mL via ORAL
  Filled 2016-02-04: qty 5

## 2016-02-04 MED ORDER — CEPHALEXIN 500 MG PO CAPS: 500.0000 mg | ORAL_CAPSULE | Freq: Four times a day (QID) | ORAL | 0 refills | Status: AC

## 2016-02-04 MED ORDER — CEPHALEXIN 500 MG PO CAPS
500.0000 mg | ORAL_CAPSULE | Freq: Once | ORAL | Status: AC
  Administered 2016-02-04: 500 mg via ORAL
  Filled 2016-02-04: qty 1

## 2016-02-04 MED ORDER — OXYMETAZOLINE HCL 0.05 % NA SOLN
NASAL | Status: AC
  Administered 2016-02-04
  Filled 2016-02-04: qty 15

## 2016-02-04 MED ORDER — LIDOCAINE HCL 4 % EX SOLN
CUTANEOUS | Status: AC
  Administered 2016-02-04
  Filled 2016-02-04: qty 50

## 2016-02-04 MED ORDER — HYDROCOD POLST-CPM POLST ER 10-8 MG/5ML PO SUER: 5.0000 mL | Freq: Two times a day (BID) | ORAL | 0 refills | Status: AC

## 2016-02-04 NOTE — Discharge Instructions (Addendum)
Please use Afrin to left near twice a day for the next 3 days. Please follow-up with ENT in the next 4-5 days. Should the bleeding return please return to the emergency department for further evaluation.

## 2016-02-04 NOTE — ED Notes (Signed)
Discharge instructions reviewed with patient. Questions fielded by this RN. Patient verbalizes understanding of instructions. Patient discharged home in stable condition per Webster MD . No acute distress noted at time of discharge.   

## 2016-02-04 NOTE — ED Notes (Signed)
ED Provider at bedside. 

## 2016-02-04 NOTE — ED Notes (Signed)
No bleeding noted except small once to left nare and stop quickly. Cough up some blood. Md Aware.

## 2016-02-04 NOTE — ED Provider Notes (Signed)
[facility] Emergency Department Provider Note   ____________________________________________   First MD Initiated Contact with Patient 02/04/16 0003     (approximate)  I have reviewed the triage vital signs and the nursing notes.   HISTORY  Chief Complaint Epistaxis    HPI Roger Oliver is a 45 y.o. male who comes into the hospital today with a nosebleed. The patient reports that he had one that started around 3:30. He reports though that it stopped on its own. The patient went to his girlfriend's family's house and he coughed really hard which then precipitated another nosebleed. He reports that that started around 7:30 and it has not stopped since then. The patient reports that he's bleeding on the right side. It is coming out of both nostrils at this time. The patient has never had anything like this happen in the past. The patient is not on any blood thinners. He denies drinking every day. He did have a sinus infection last weekend but he denies any pain. He is here because he is unable to stop bleeding from his nose.   Past Medical History:  Diagnosis Date  . Allergy   . Depression   . Thyroid disease    Hypothyroidism    There are no active problems to display for this patient.   Past Surgical History:  Procedure Laterality Date  . EYE SURGERY    . FRACTURE SURGERY    . SHOULDER SURGERY      Prior to Admission medications   Medication Sig Start Date End Date Taking? Authorizing Provider  cephALEXin (KEFLEX) 500 MG capsule Take 1 capsule (500 mg total) by mouth 4 (four) times daily. 02/04/16 02/08/16  Rebecka ApleyAllison P Bailynn Dyk, MD  chlorpheniramine-HYDROcodone [facility](TUSSIONEX PENNKINETIC ER) 10-8 MG/5ML SUER Take 5 mLs by mouth 2 (two) times daily. 02/04/16   Rebecka ApleyAllison P Ajiah Mcglinn, MD  levothyroxine (SYNTHROID, LEVOTHROID) 50 MCG tablet Take 50 mcg by mouth daily.    Historical Provider, MD  oseltamivir (TAMIFLU) 75 MG capsule Take 1 capsule (75 mg  total) by mouth 2 (two) times daily. 04/23/15   Collene GobbleSteven A Daub, MD  traZODone (DESYREL) 50 MG tablet Take 50 mg by mouth at bedtime.    Historical Provider, MD    Allergies Shellfish allergy  Family History  Problem Relation Age of Onset  . Diabetes Father   . Heart disease Father   . Hyperlipidemia Father   . Hyperlipidemia Mother   . Cancer Sister   . Hyperlipidemia Brother   . Diabetes Brother   . Diabetes Sister     Social History Social History  Substance Use Topics  . Smoking status: Never Smoker  . Smokeless tobacco: Current User    Types: Snuff  . Alcohol use 3.0 oz/week    5 Cans of beer per week    Review of Systems Constitutional: No fever/chills Eyes: No visual changes. ENT: Nosebleed Cardiovascular: Denies chest pain. Respiratory: Denies shortness of breath. Gastrointestinal: No abdominal pain.  No nausea, no vomiting.  No diarrhea.  No constipation. Genitourinary: Negative for dysuria. Musculoskeletal: Negative for back pain. Skin: Negative for rash. Neurological: Negative for headaches, focal weakness or numbness.  10-point ROS otherwise negative.  ____________________________________________   PHYSICAL EXAM:  VITAL SIGNS: ED Triage Vitals [02/03/16 2216]  Enc Vitals Group     BP (!) 143/86     Pulse Rate (!) 58     Resp 17     Temp      Temp Source  Oral     SpO2 98 %     Weight 212 lb (96.2 kg)     Height 6' (1.829 m)     Head Circumference      Peak Flow      Pain Score      Pain Loc      Pain Edu?      Excl. in GC?     Constitutional: Alert and oriented. Well appearing and in Moderate distress. Eyes: Conjunctivae are normal. PERRL. EOMI. Head: Atraumatic. Nose: Epistaxis. Active brisk bleeding coming from the right nostril with clots and more bleeding coming from the left nostril. Mouth/Throat: Mucous membranes are moist.  Oropharynx non-erythematous. Cardiovascular: Normal rate, regular rhythm. Grossly normal heart sounds.  Good  peripheral circulation. Respiratory: Normal respiratory effort.  No retractions. Lungs CTAB. Gastrointestinal: Soft and nontender. No distention.  Musculoskeletal: No lower extremity tenderness nor edema.   Neurologic:  Normal speech and language.  Skin:  Skin is warm, dry and intact.  Psychiatric: Mood and affect are normal.   ____________________________________________   LABS (all labs ordered are listed, but only abnormal results are displayed)  Labs Reviewed  COMPREHENSIVE METABOLIC PANEL - Abnormal; Notable for the following:       Result Value   Glucose, Bld 100 (*)    AST 42 (*)    All other components within normal limits  CBC  PROTIME-INR  APTT   ____________________________________________  EKG  none ____________________________________________  RADIOLOGY  none ____________________________________________   PROCEDURES  Procedure(s) performed: please, see procedure note(s).  .Epistaxis Management Date/Time: 02/04/2016 12:15 AM Performed by: Rebecka ApleyWEBSTER, Denean Pavon P Authorized by: Rebecka ApleyWEBSTER, Ege Muckey P   Consent:    Consent obtained:  Verbal   Consent given by:  Patient Anesthesia (see MAR for exact dosages):    Anesthesia method:  Topical application   Topical anesthesia: lidocaine. Procedure details:    Treatment site:  Unable to specify   Treatment method:  Merocel sponge   Treatment complexity:  Limited   Treatment episode: initial   Post-procedure details:    Assessment:  Bleeding stopped   Patient tolerance of procedure:  Tolerated well, no immediate complications    Critical Care performed: No  ____________________________________________   INITIAL IMPRESSION / ASSESSMENT AND PLAN / ED COURSE  Pertinent labs & imaging results that were available during my care of the patient were reviewed by me and considered in my medical decision making (see chart for details).  This is a 45 year old male who comes into the hospital today with  epistaxis. I did go into the room immediately to help alleviate the bleeding. I instilled Afrin to the patient's nares bilaterally but he did not have any improvement in the bleeding. I placed a Merocel sponge into the patient's right nostril and then I did pinch the patient's nostrils. After approximately 15 minutes I did go back to recheck on the patient. I removed the pincers and although there was a slight amount of bleeding from the left nostril it cleared up and the patient stopped having bleeding. I will check some blood work and I will then reassess the patient's bleeding.  Clinical Course    The patient's blood work is unremarkable. After some time in the emergency department the patient states that he still just has some slight bleeding when he dabs his nose but no active bleeding. The patient does not want a Murocel packing to the left nose. I will discharge the patient home to have him follow back up  with ENT. He should return with any worsening symptoms.  ____________________________________________   FINAL CLINICAL IMPRESSION(S) / ED DIAGNOSES  Final diagnoses:  Epistaxis      NEW MEDICATIONS STARTED DURING THIS VISIT:  New Prescriptions   CEPHALEXIN (KEFLEX) 500 MG CAPSULE    Take 1 capsule (500 mg total) by mouth 4 (four) times daily.   CHLORPHENIRAMINE-HYDROCODONE (TUSSIONEX PENNKINETIC ER) 10-8 MG/5ML SUER    Take 5 mLs by mouth 2 (two) times daily.     Note:  This document was prepared using Dragon voice recognition software and may include unintentional dictation errors.    Rebecka Apley, MD 02/04/16 [id number]

## 2016-02-04 NOTE — ED Notes (Signed)
Merocel applied into right nare. Large clots of left nare at this time. Nose clip applied. Will reassess.

## 2016-04-25 DIAGNOSIS — R946 Abnormal results of thyroid function studies: Secondary | ICD-10-CM | POA: Diagnosis not present

## 2017-01-28 DIAGNOSIS — Z136 Encounter for screening for cardiovascular disorders: Secondary | ICD-10-CM | POA: Diagnosis not present

## 2017-01-28 DIAGNOSIS — Z0000 Encounter for general adult medical examination without abnormal findings: Secondary | ICD-10-CM | POA: Diagnosis not present

## 2017-01-28 DIAGNOSIS — Z23 Encounter for immunization: Secondary | ICD-10-CM | POA: Diagnosis not present

## 2017-10-17 IMAGING — CR DG CHEST 2V
2 series · 2 of 2 positions shown · non-contrast
Comparison: 01/10/2013

CLINICAL DATA: Fever and chills.  Body aches.

EXAM:
CHEST  2 VIEW

[PA]
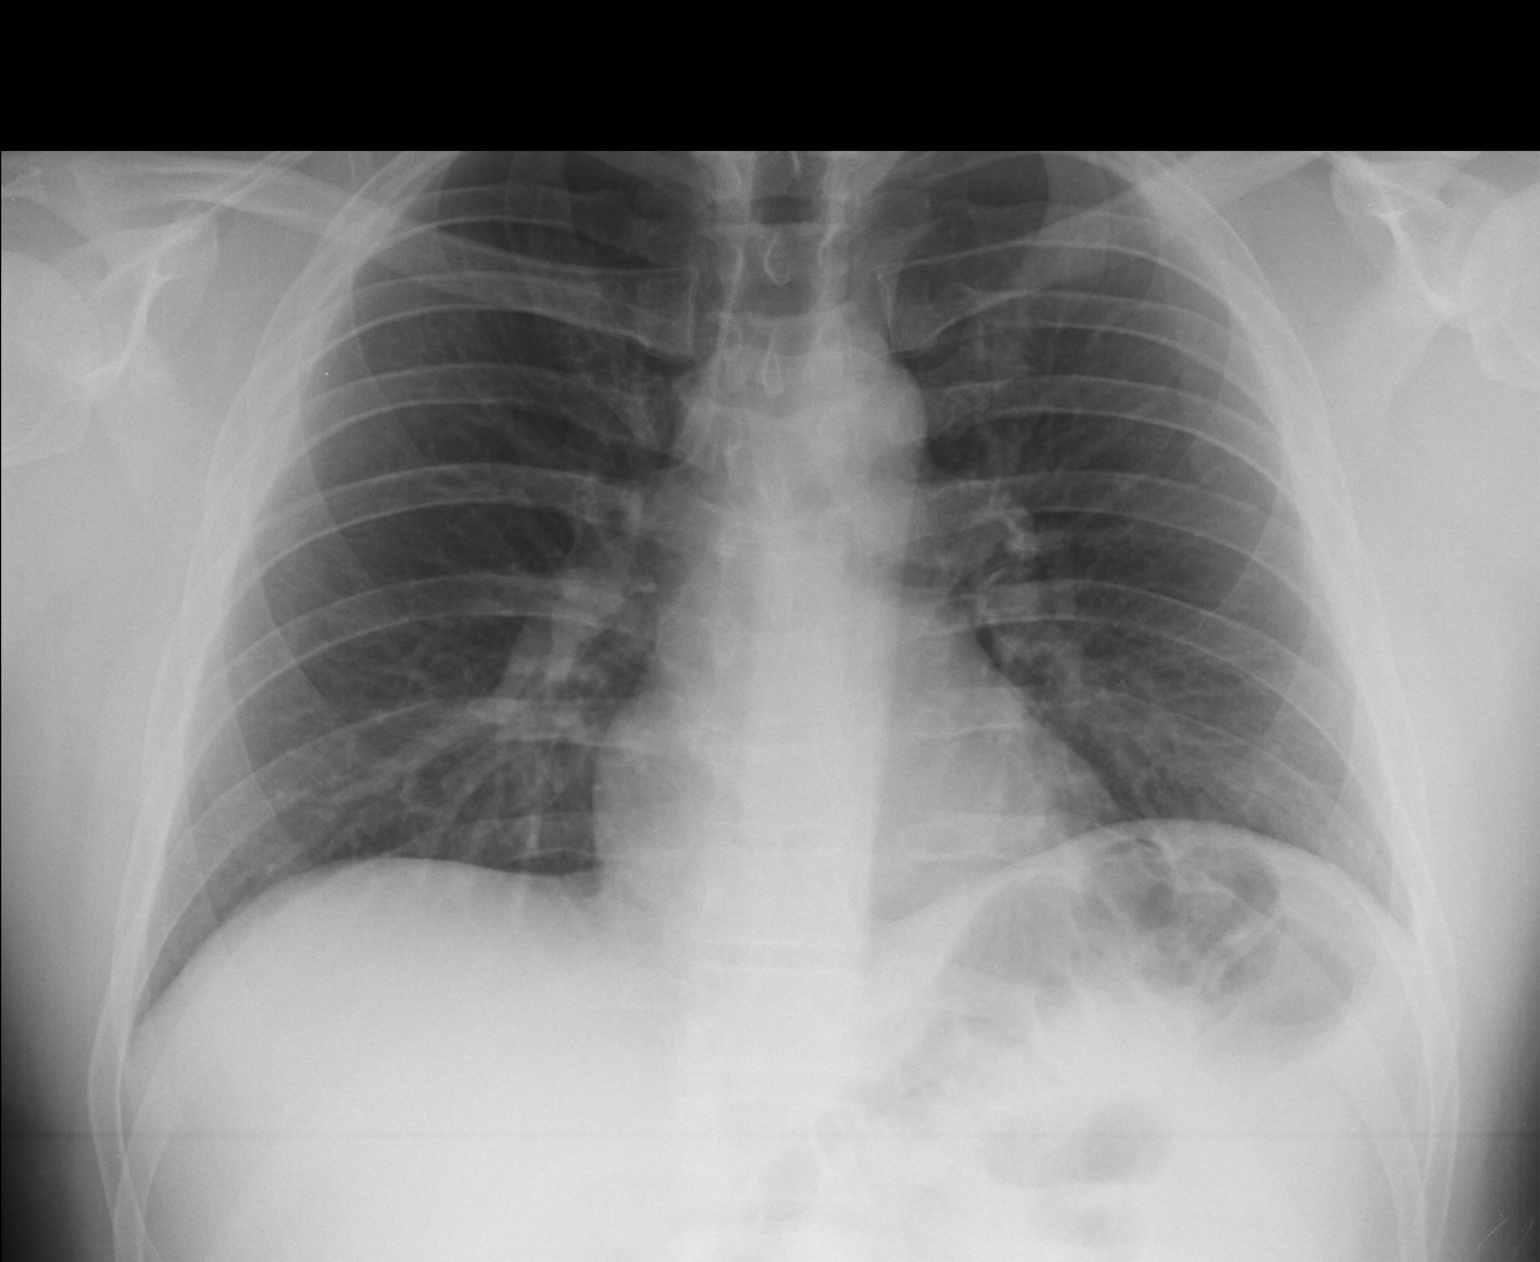

[lateral]
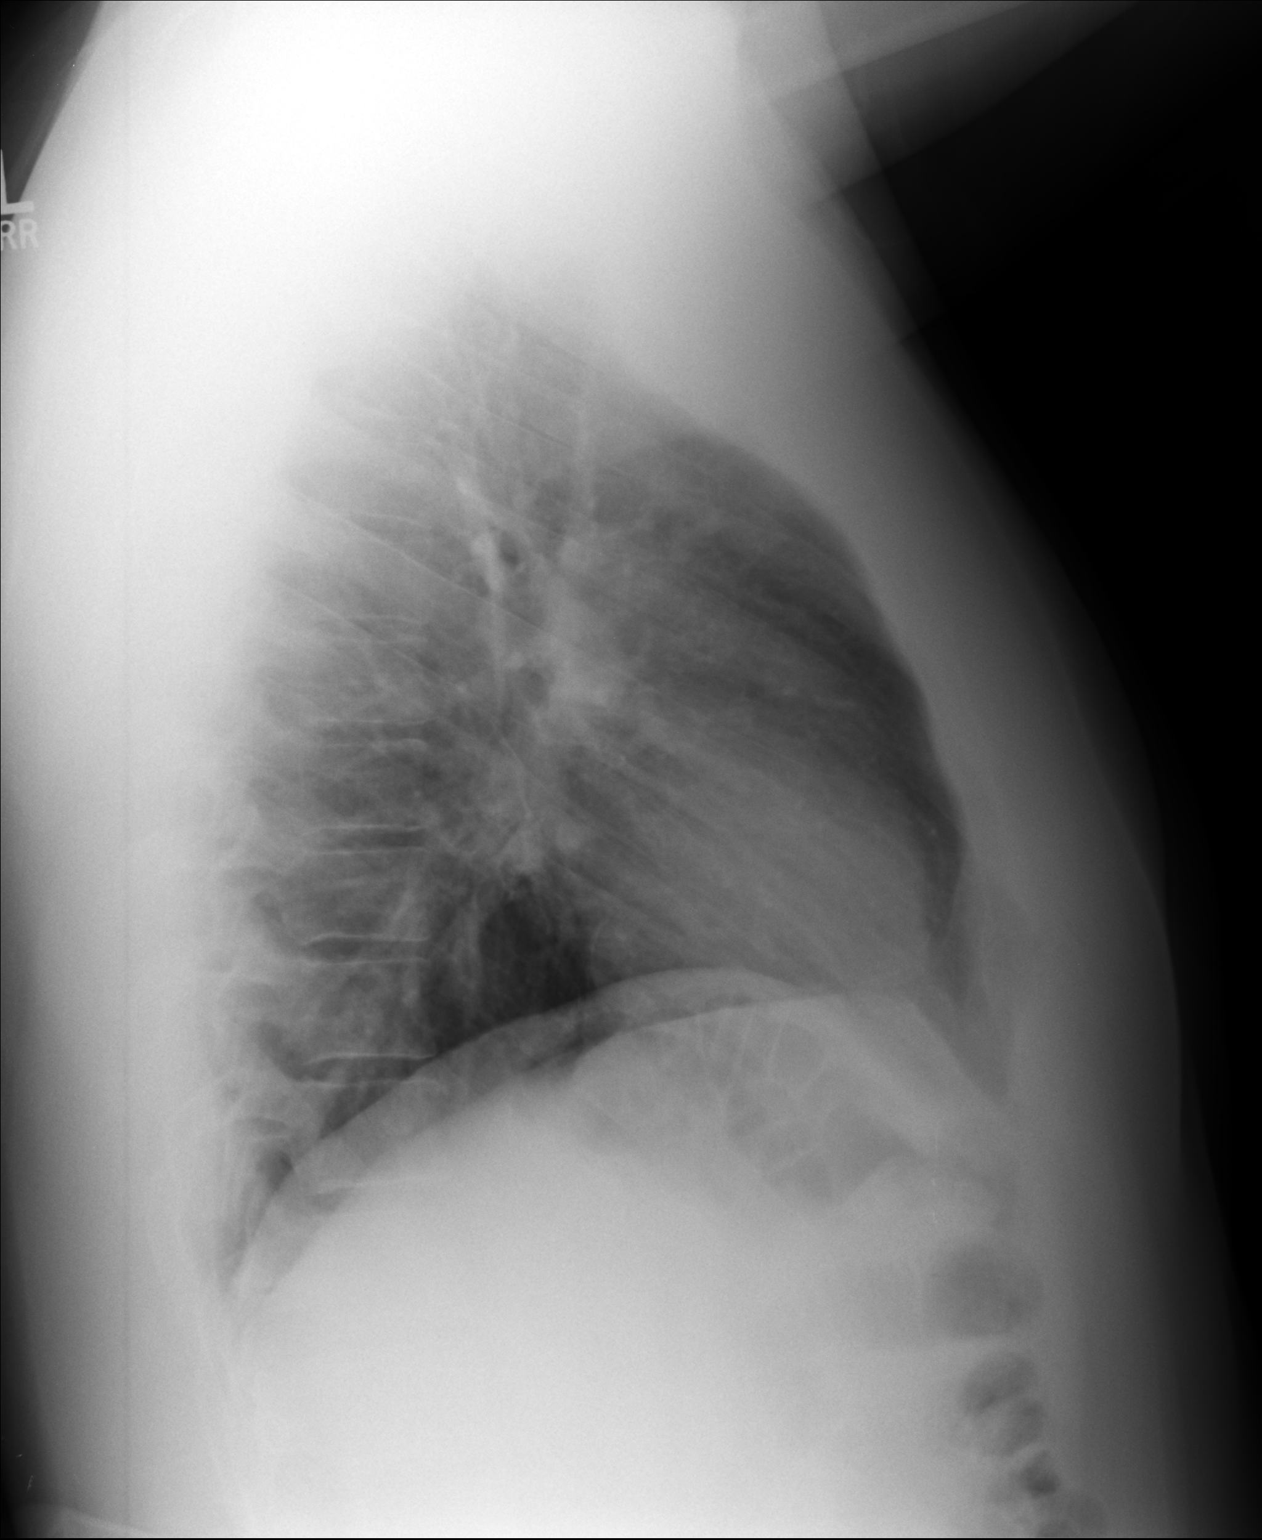

[2 of 2 positions shown; findings below may reference images not displayed]

FINDINGS: Midline trachea.  Normal heart size and mediastinal contours.

Sharp costophrenic angles.  No pneumothorax.  Clear lungs.
IMPRESSION: No active cardiopulmonary disease.

## 2018-02-19 DIAGNOSIS — Z13220 Encounter for screening for lipoid disorders: Secondary | ICD-10-CM | POA: Diagnosis not present

## 2018-02-19 DIAGNOSIS — Z23 Encounter for immunization: Secondary | ICD-10-CM | POA: Diagnosis not present

## 2018-02-19 DIAGNOSIS — E039 Hypothyroidism, unspecified: Secondary | ICD-10-CM | POA: Diagnosis not present

## 2018-02-19 DIAGNOSIS — Z0000 Encounter for general adult medical examination without abnormal findings: Secondary | ICD-10-CM | POA: Diagnosis not present

## 2018-03-23 DIAGNOSIS — B349 Viral infection, unspecified: Secondary | ICD-10-CM | POA: Diagnosis not present

## 2018-03-23 DIAGNOSIS — R509 Fever, unspecified: Secondary | ICD-10-CM | POA: Diagnosis not present

## 2020-03-16 ENCOUNTER — Other Ambulatory Visit: Payer: Self-pay | Admitting: Physician Assistant

## 2020-03-16 ENCOUNTER — Ambulatory Visit
Admission: RE | Admit: 2020-03-16 | Discharge: 2020-03-16 | Disposition: A | Discharge status: Home / Self Care | Payer: 59 | Source: Ambulatory Visit | Attending: Physician Assistant | Admitting: Physician Assistant

## 2020-03-16 DIAGNOSIS — R058 Other specified cough: Secondary | ICD-10-CM

## 2022-07-28 ENCOUNTER — Ambulatory Visit
Admission: RE | Admit: 2022-07-28 | Discharge: 2022-07-28 | Disposition: A | Discharge status: Home / Self Care | Payer: Worker's Compensation | Source: Ambulatory Visit | Attending: Nurse Practitioner | Admitting: Nurse Practitioner

## 2022-07-28 ENCOUNTER — Other Ambulatory Visit: Payer: Self-pay | Admitting: Nurse Practitioner

## 2022-07-28 DIAGNOSIS — M545 Low back pain, unspecified: Secondary | ICD-10-CM

## 2022-09-10 IMAGING — CR DG CHEST 2V
2 series · 2 of 2 positions shown · non-contrast
Comparison: April 23, 2015

CLINICAL DATA: Post viral cough.  Post [id number].

EXAM:
CHEST - 2 VIEW

[w chest pa]
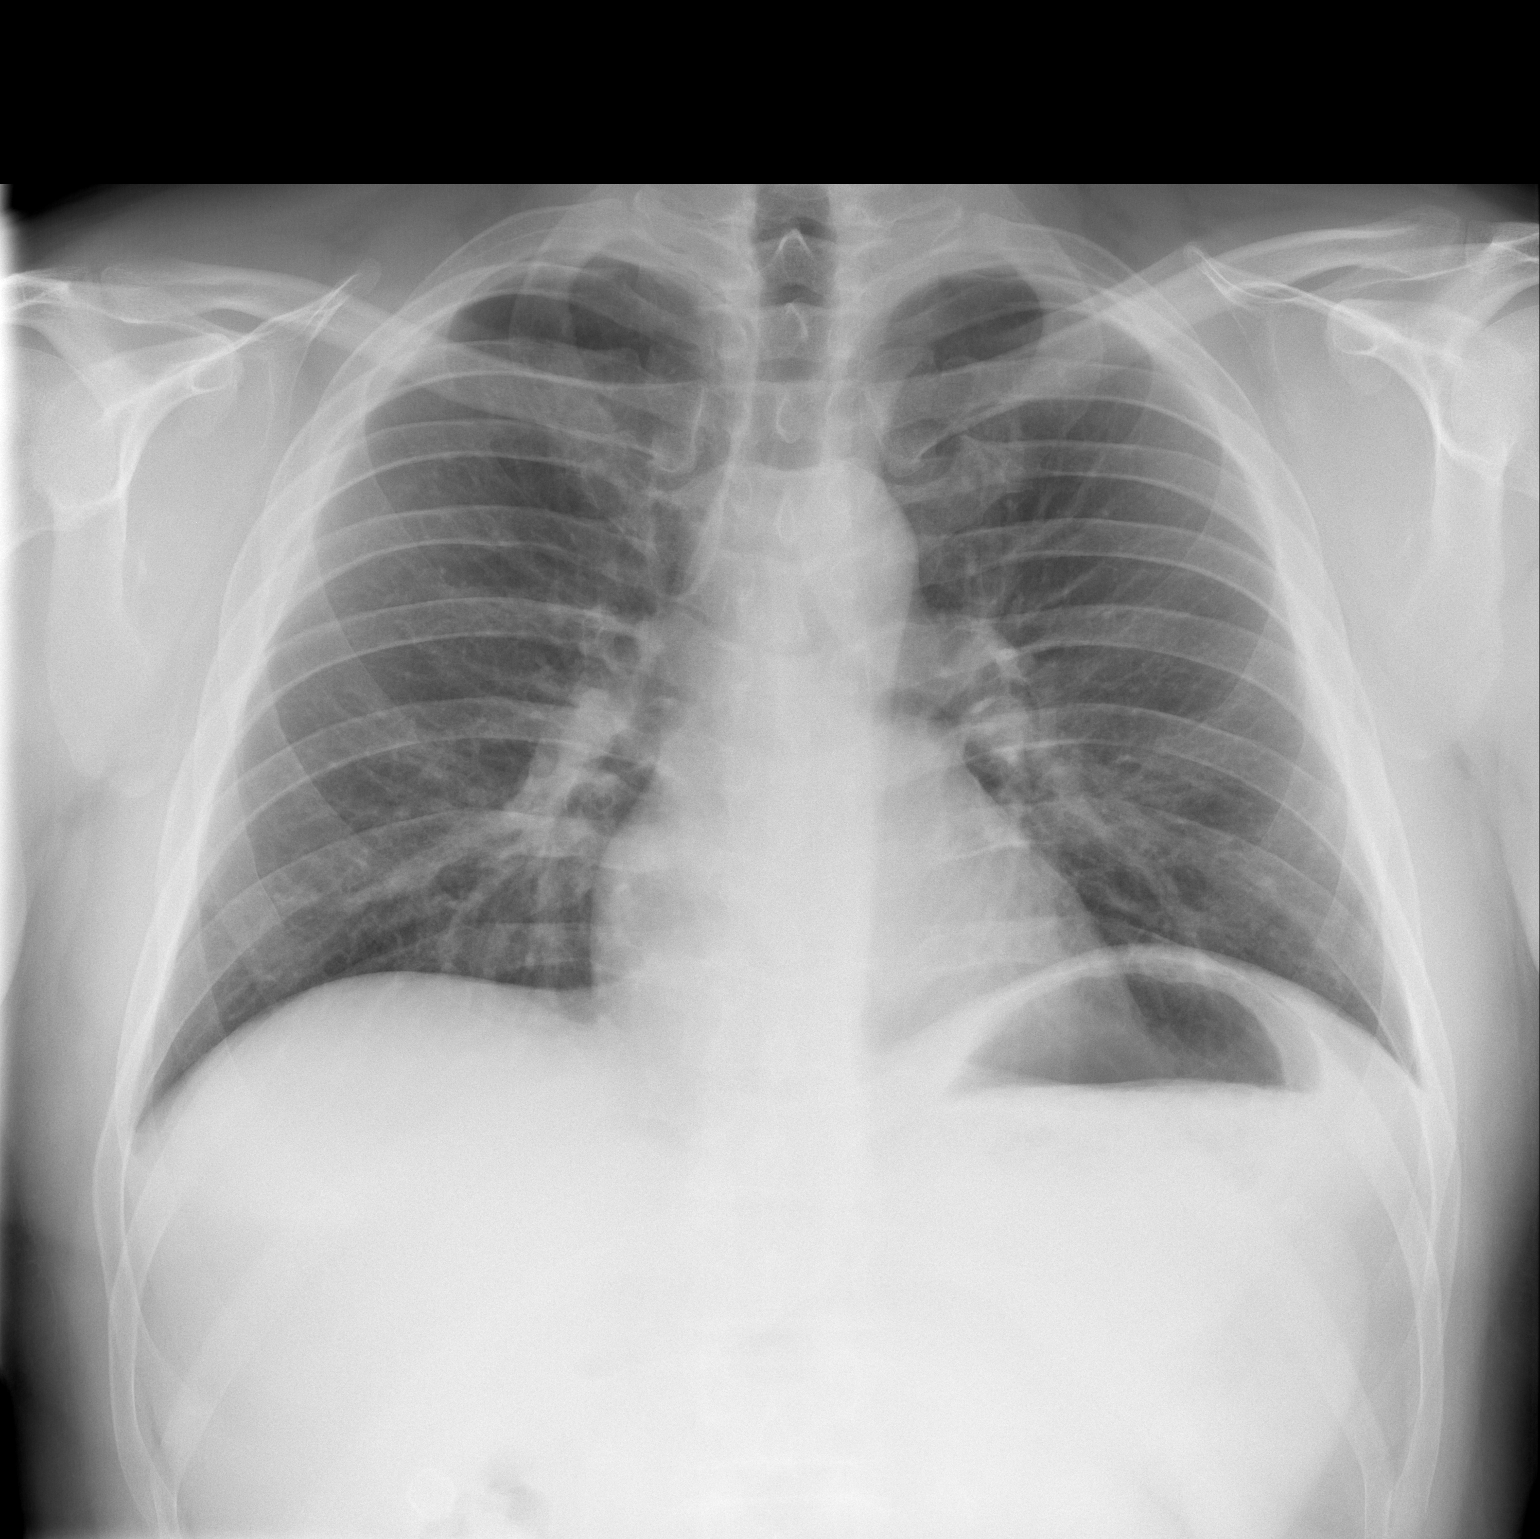

[w chest lat]
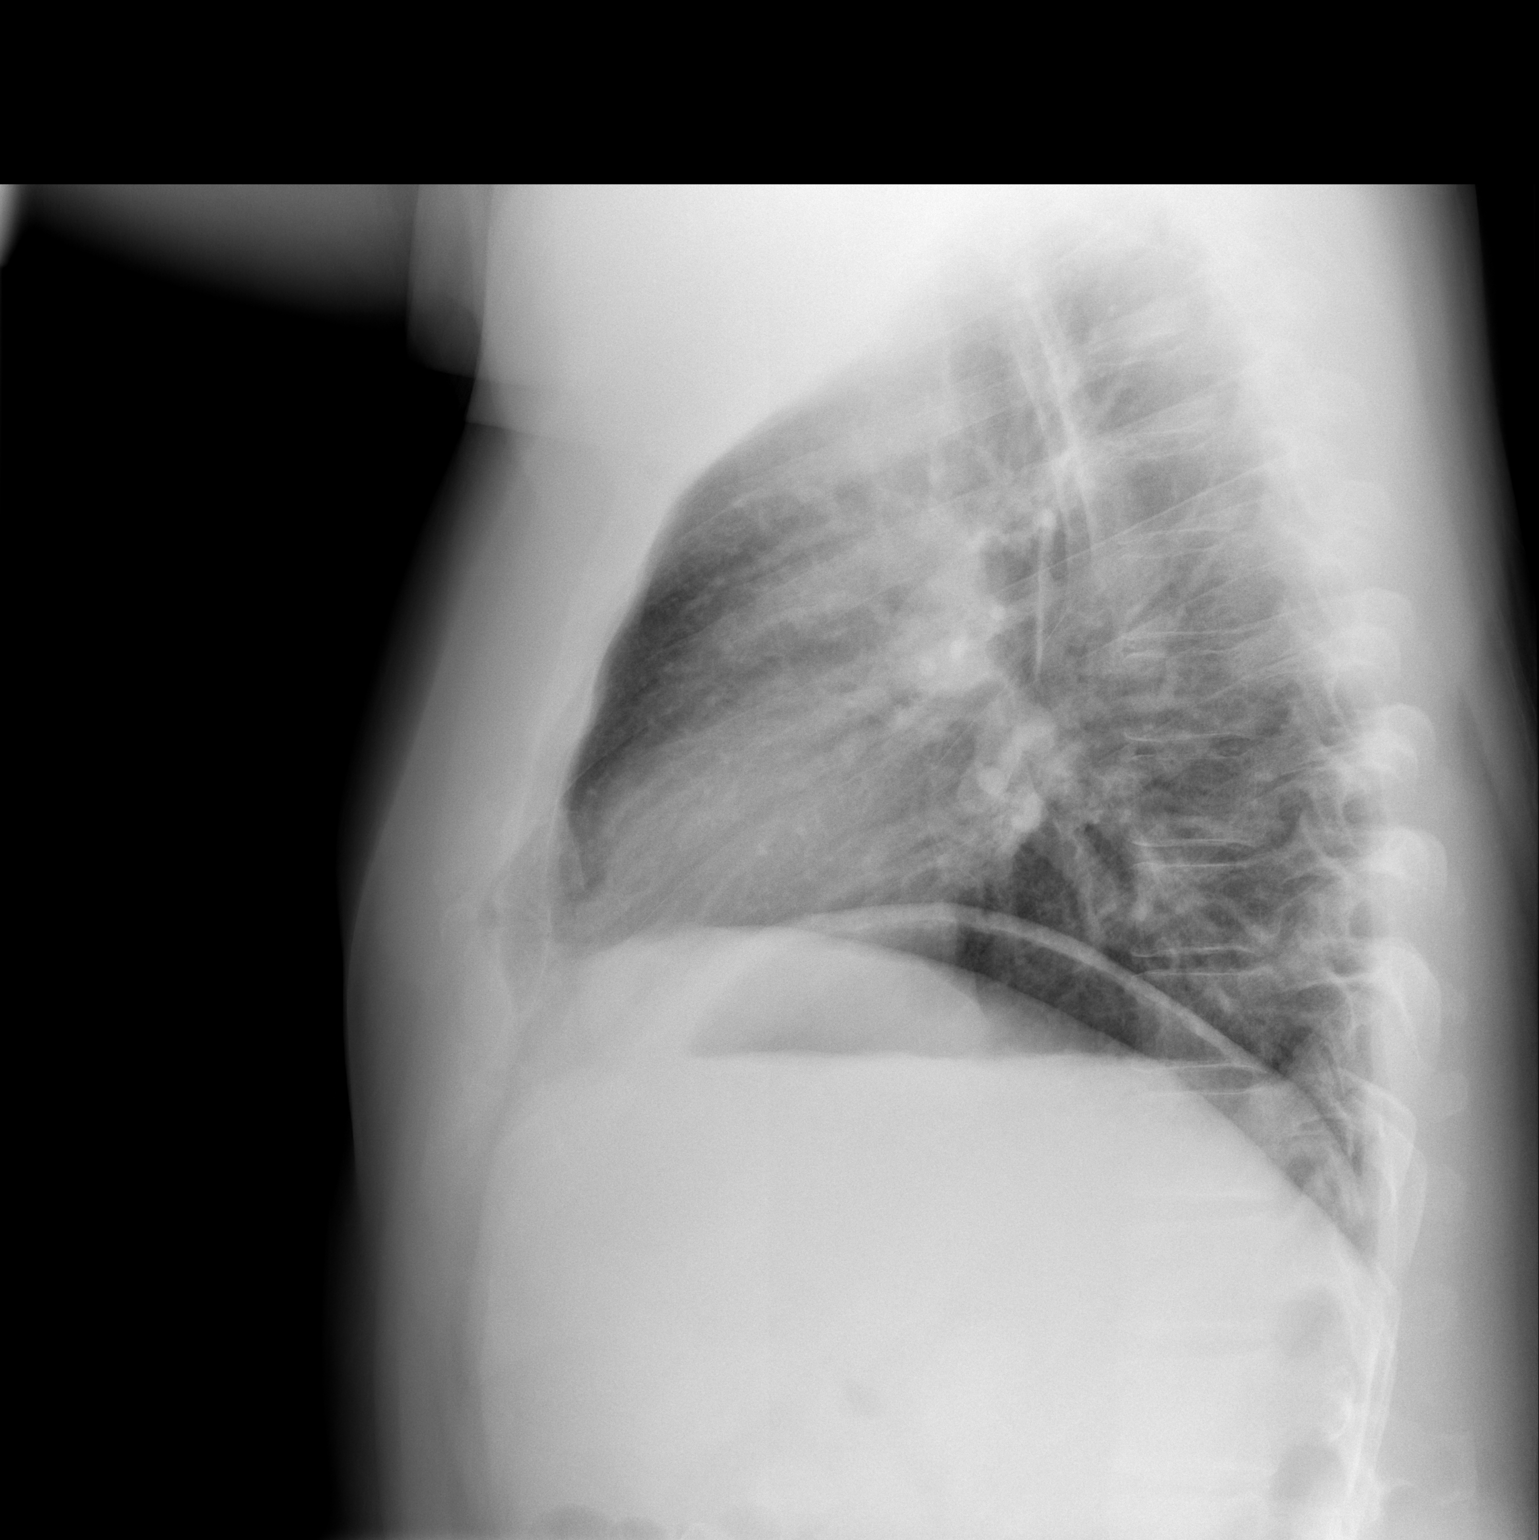

[2 of 2 positions shown; findings below may reference images not displayed]

FINDINGS: The heart size and mediastinal contours are within normal limits.
Both lungs are clear. The visualized skeletal structures are
unremarkable.
IMPRESSION: No active cardiopulmonary disease.

## 2022-11-06 ENCOUNTER — Other Ambulatory Visit: Payer: Self-pay | Admitting: Neurological Surgery

## 2022-11-06 DIAGNOSIS — M5416 Radiculopathy, lumbar region: Secondary | ICD-10-CM

## 2022-11-26 ENCOUNTER — Ambulatory Visit
Admission: RE | Admit: 2022-11-26 | Discharge: 2022-11-26 | Disposition: A | Discharge status: Home / Self Care | Payer: Worker's Compensation | Source: Ambulatory Visit | Attending: Neurological Surgery | Admitting: Neurological Surgery

## 2022-11-26 DIAGNOSIS — M5416 Radiculopathy, lumbar region: Secondary | ICD-10-CM
